# Patient Record
Sex: Male | Born: 1960 | Race: Black or African American | Hispanic: No | Marital: Single | Smoking: Current every day smoker
Health system: Southern US, Community
[De-identification: ages and names within clinical notes are randomized; demographics above are authoritative.]

## PROBLEM LIST (undated history)

## (undated) DIAGNOSIS — I10 Essential (primary) hypertension: Secondary | ICD-10-CM

## (undated) HISTORY — PX: BACK SURGERY: SHX140

---

## 2008-03-27 ENCOUNTER — Ambulatory Visit: Payer: Self-pay | Admitting: Family Medicine

## 2008-03-27 DIAGNOSIS — F17200 Nicotine dependence, unspecified, uncomplicated: Secondary | ICD-10-CM | POA: Insufficient documentation

## 2008-03-27 DIAGNOSIS — R062 Wheezing: Secondary | ICD-10-CM | POA: Insufficient documentation

## 2008-03-27 DIAGNOSIS — F528 Other sexual dysfunction not due to a substance or known physiological condition: Secondary | ICD-10-CM | POA: Insufficient documentation

## 2008-03-27 DIAGNOSIS — I10 Essential (primary) hypertension: Secondary | ICD-10-CM | POA: Insufficient documentation

## 2008-03-27 DIAGNOSIS — D179 Benign lipomatous neoplasm, unspecified: Secondary | ICD-10-CM | POA: Insufficient documentation

## 2008-03-27 LAB — CONVERTED CEMR LAB
Ketones, urine, test strip: NEGATIVE
Protein, U semiquant: NEGATIVE
Specific Gravity, Urine: 1.015
Urobilinogen, UA: 0.2
pH: 7

## 2008-03-29 ENCOUNTER — Encounter (INDEPENDENT_AMBULATORY_CARE_PROVIDER_SITE_OTHER): Payer: Self-pay | Admitting: Family Medicine

## 2008-03-31 LAB — CONVERTED CEMR LAB
Albumin: 4.3 g/dL (ref 3.5–5.2)
BUN: 17 mg/dL (ref 6–23)
Basophils Absolute: 0 10*3/uL (ref 0.0–0.1)
Basophils Relative: 0 % (ref 0–1)
Calcium: 9 mg/dL (ref 8.4–10.5)
Chloride: 106 meq/L (ref 96–112)
Cholesterol: 137 mg/dL (ref 0–200)
Creatinine, Ser: 0.88 mg/dL (ref 0.40–1.50)
Eosinophils Relative: 2 % (ref 0–5)
Glucose, Bld: 101 mg/dL — ABNORMAL HIGH (ref 70–99)
HCT: 42.8 % (ref 39.0–52.0)
Lymphocytes Relative: 22 % (ref 12–46)
MCV: 90.5 fL (ref 78.0–100.0)
Neutro Abs: 5.3 10*3/uL (ref 1.7–7.7)
Neutrophils Relative %: 67 % (ref 43–77)
PSA: 0.65 ng/mL (ref 0.10–4.00)
Platelets: 300 10*3/uL (ref 150–400)
RBC: 4.73 M/uL (ref 4.22–5.81)
TSH: 1.388 microintl units/mL (ref 0.350–4.50)
Total CHOL/HDL Ratio: 3
Triglycerides: 65 mg/dL (ref <150)
WBC: 7.9 10*3/uL (ref 4.0–10.5)

## 2008-04-01 ENCOUNTER — Ambulatory Visit (HOSPITAL_COMMUNITY): Admission: RE | Admit: 2008-04-01 | Discharge: 2008-04-01 | Payer: Self-pay | Admitting: Family Medicine

## 2008-04-01 DIAGNOSIS — J984 Other disorders of lung: Secondary | ICD-10-CM | POA: Insufficient documentation

## 2008-04-04 ENCOUNTER — Ambulatory Visit (HOSPITAL_COMMUNITY): Admission: RE | Admit: 2008-04-04 | Discharge: 2008-04-04 | Payer: Self-pay | Admitting: Family Medicine

## 2008-04-08 ENCOUNTER — Encounter (INDEPENDENT_AMBULATORY_CARE_PROVIDER_SITE_OTHER): Payer: Self-pay | Admitting: Family Medicine

## 2008-04-14 ENCOUNTER — Ambulatory Visit: Payer: Self-pay | Admitting: Family Medicine

## 2008-04-14 LAB — CONVERTED CEMR LAB
Cholesterol, target level: 200 mg/dL
HDL goal, serum: 40 mg/dL
LDL Goal: 130 mg/dL

## 2008-04-21 ENCOUNTER — Ambulatory Visit: Payer: Self-pay | Admitting: Family Medicine

## 2008-04-21 DIAGNOSIS — IMO0002 Reserved for concepts with insufficient information to code with codable children: Secondary | ICD-10-CM | POA: Insufficient documentation

## 2008-04-24 ENCOUNTER — Ambulatory Visit: Payer: Self-pay | Admitting: Family Medicine

## 2008-04-28 ENCOUNTER — Ambulatory Visit (HOSPITAL_COMMUNITY): Admission: RE | Admit: 2008-04-28 | Discharge: 2008-04-28 | Payer: Self-pay | Admitting: Family Medicine

## 2008-04-28 ENCOUNTER — Encounter (INDEPENDENT_AMBULATORY_CARE_PROVIDER_SITE_OTHER): Payer: Self-pay | Admitting: Family Medicine

## 2008-04-28 ENCOUNTER — Ambulatory Visit: Payer: Self-pay | Admitting: Cardiology

## 2008-05-19 ENCOUNTER — Ambulatory Visit: Payer: Self-pay | Admitting: Family Medicine

## 2008-05-19 DIAGNOSIS — R252 Cramp and spasm: Secondary | ICD-10-CM | POA: Insufficient documentation

## 2009-08-25 ENCOUNTER — Encounter: Payer: Self-pay | Admitting: Physician Assistant

## 2009-09-01 ENCOUNTER — Emergency Department (HOSPITAL_COMMUNITY)
Admission: EM | Admit: 2009-09-01 | Discharge: 2009-09-01 | Payer: Self-pay | Source: Home / Self Care | Admitting: Emergency Medicine

## 2010-04-19 ENCOUNTER — Ambulatory Visit: Payer: Self-pay | Admitting: Internal Medicine

## 2010-04-19 DIAGNOSIS — K625 Hemorrhage of anus and rectum: Secondary | ICD-10-CM | POA: Insufficient documentation

## 2010-05-08 IMAGING — CT CT CHEST W/ CM
1 series · 16 of 31 positions shown, 20 images · IV contrast (Omnipaque 300)
Comparison: None
Correlation:  Chest radiograph 04/01/2008

CLINICAL DATA: Abnormal chest x-ray, right lower lobe pulmonary
nodule

CT CHEST WITH CONTRAST
TECHNIQUE: Multidetector CT imaging of the chest was performed
following the standard protocol during bolus administration of
intravenous contrast.Sagittal and coronal MPR images reconstructed
from axial data set.
Contrast: 80 ml [id number]-[id number]

[Series 2: chestroutine 5.0 b40f · axial · 0.72mm/px · z∈[+785,+1060]mm · 16 of 61 slices shown, 20 images]
[im 3/61  mediastinal]
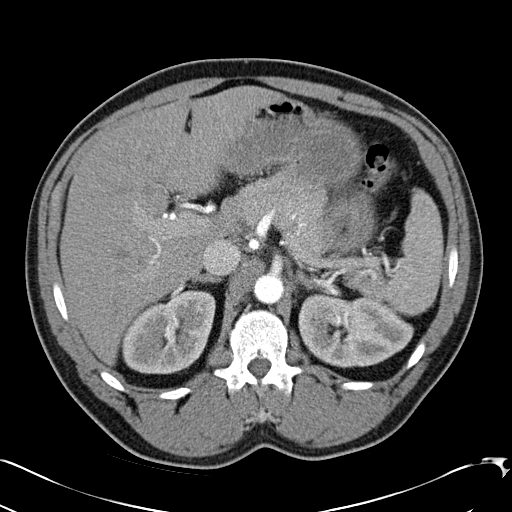
[im 3/61  lung]
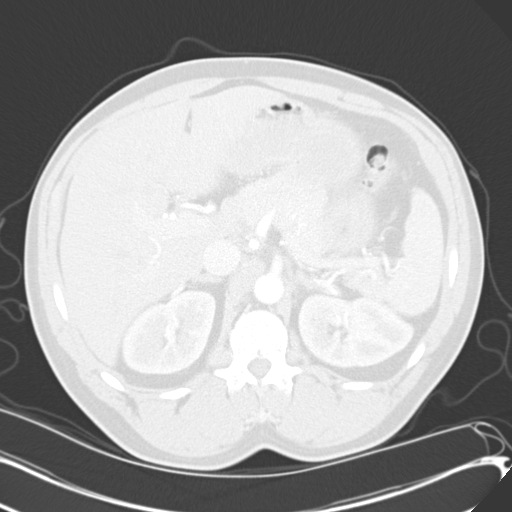
[im 7/61  lung]
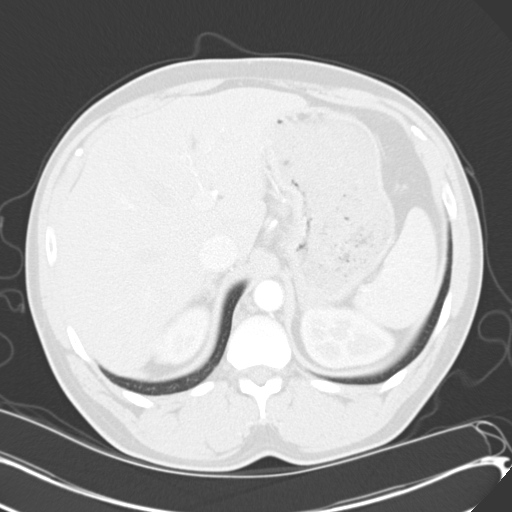
[im 12/61  lung]
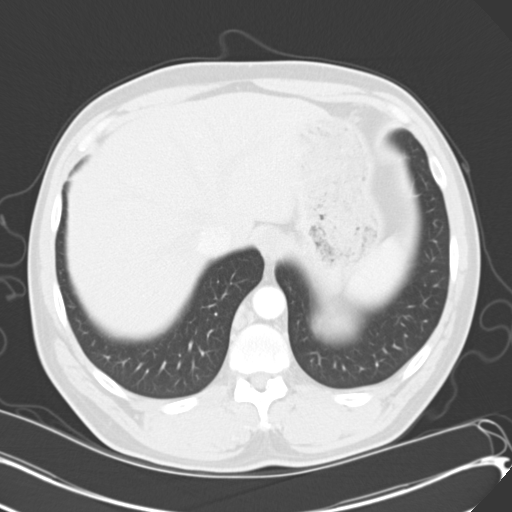
[im 14/61  lung]
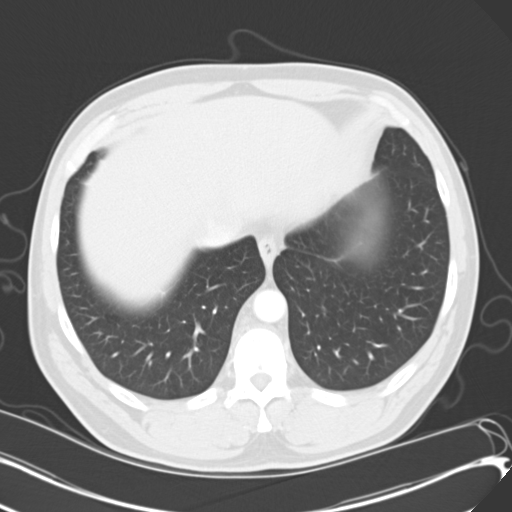
[im 18/61  mediastinal]
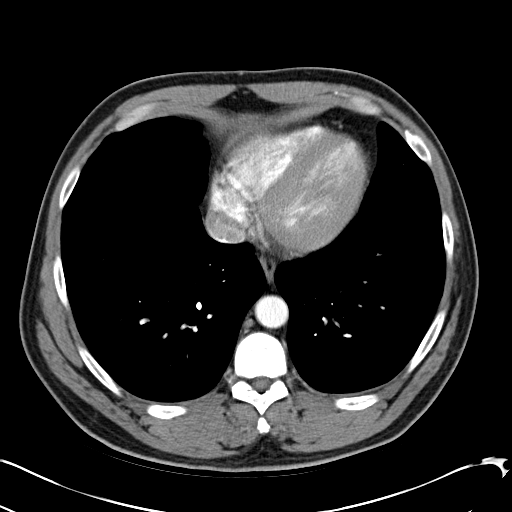
[im 18/61  lung]
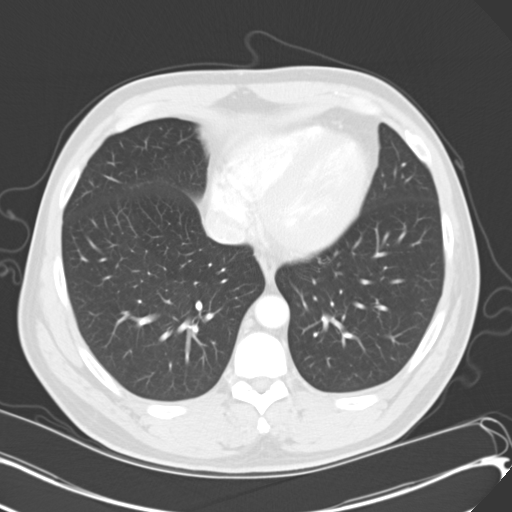
[im 21/61  lung]
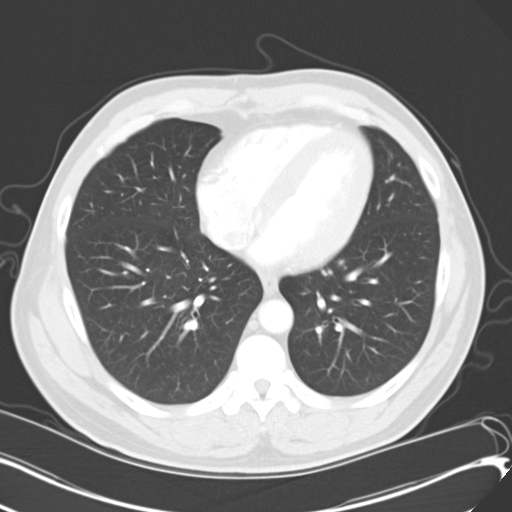
[im 25/61  lung]
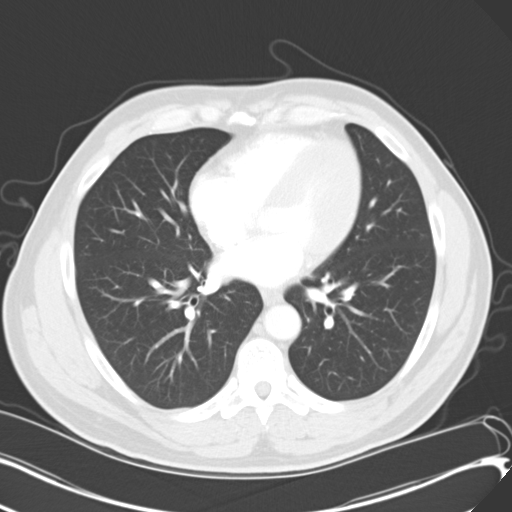
[im 29/61  lung]
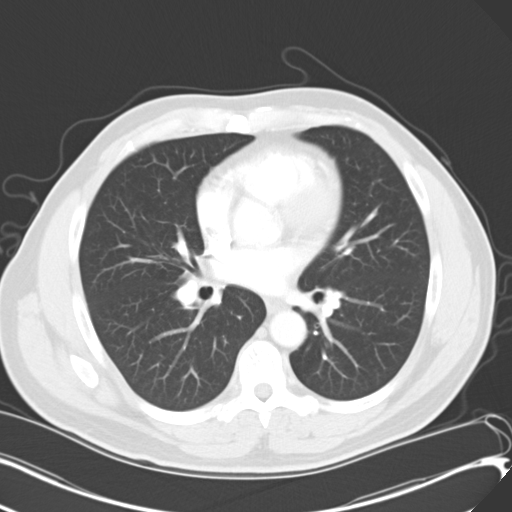
[im 33/61  mediastinal]
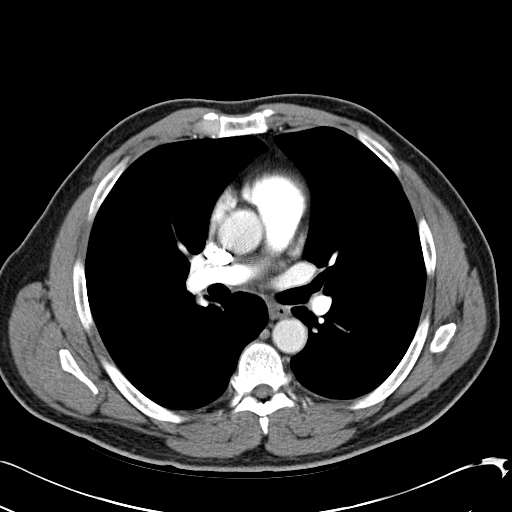
[im 33/61  lung]
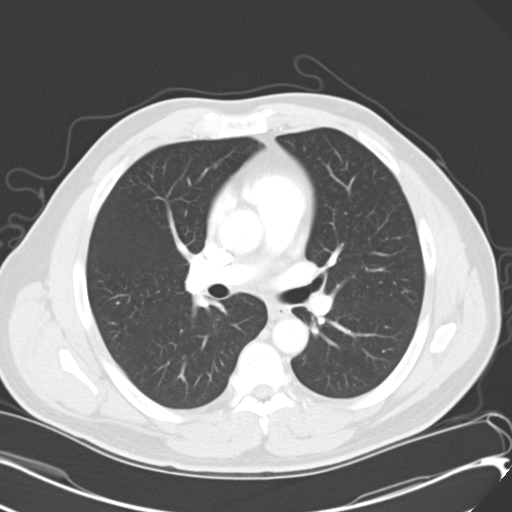
[im 36/61  lung]
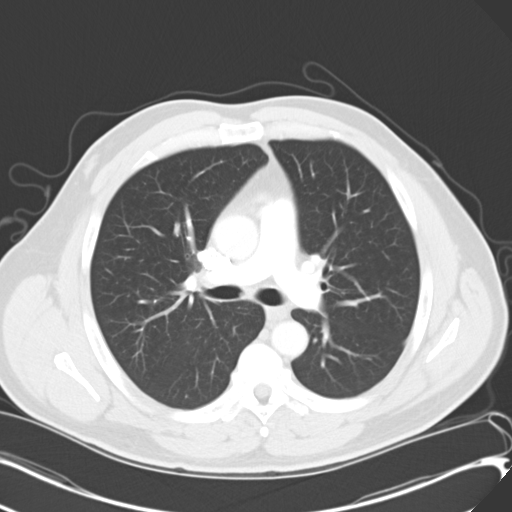
[im 38/61  lung]
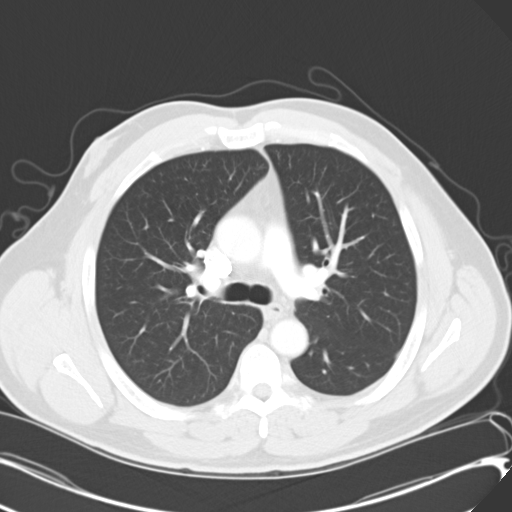
[im 41/61  lung]
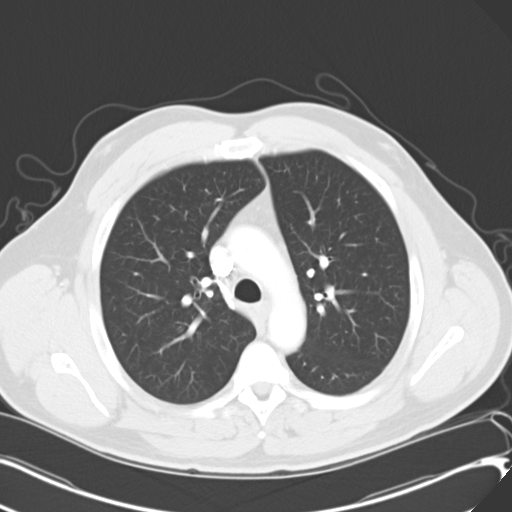
[im 45/61  mediastinal]
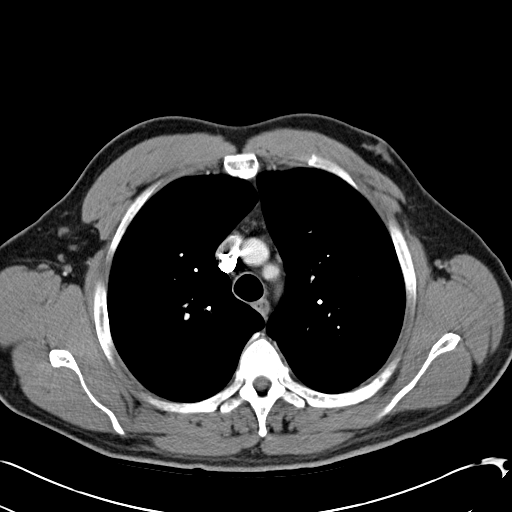
[im 45/61  lung]
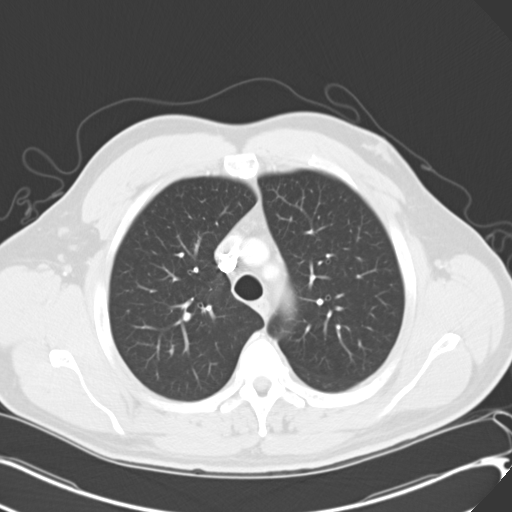
[im 49/61  lung]
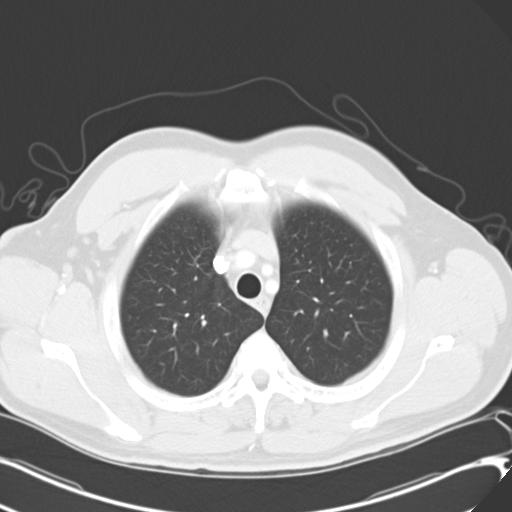
[im 54/61  lung]
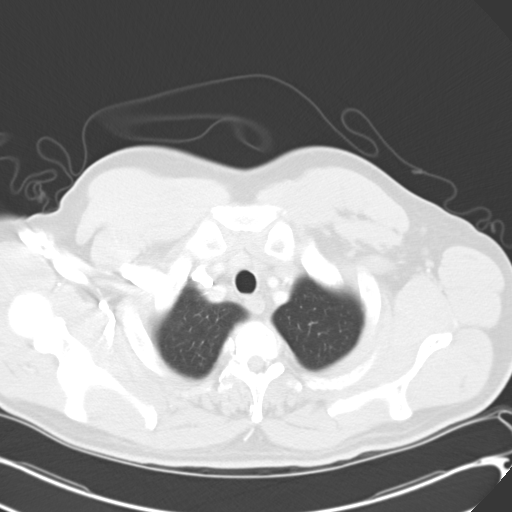
[im 58/61  lung]
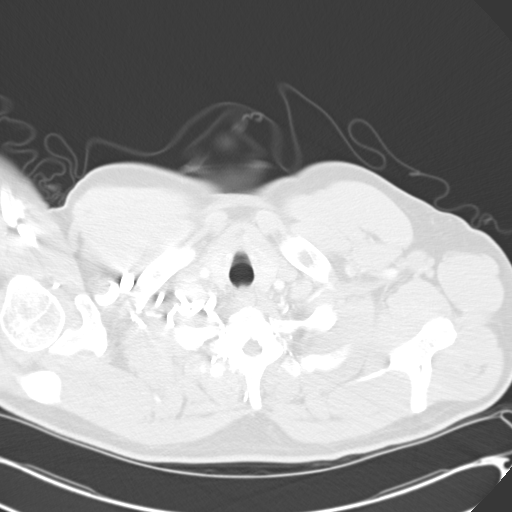

[16 of 31 positions shown; findings below may reference images not displayed]

FINDINGS: Thoracic vascular structures grossly patent on non dedicated exam.
Aorta normal caliber.
No thoracic adenopathy.
Visualized portion of upper abdomen normal.
No pulmonary mass, nodule, infiltrate, or effusion.
The observed radiographic abnormality is is shown to be due to a
bone island in a posterior lower right rib, image 41.
IMPRESSION: No evidence of pulmonary nodule.
Bone island in a posterior lower right rib, approximately ninth,
accounting for chest x-ray abnormality.

## 2010-05-14 ENCOUNTER — Ambulatory Visit (HOSPITAL_COMMUNITY)
Admission: RE | Admit: 2010-05-14 | Discharge: 2010-05-14 | Payer: Self-pay | Source: Home / Self Care | Attending: Internal Medicine | Admitting: Internal Medicine

## 2010-06-15 NOTE — Letter (Signed)
Summary: MEDICAL RELEASE  MEDICAL RELEASE   Imported By: Lind Guest 08/25/2009 11:09:26  _____________________________________________________________________  External Attachment:    Type:   Image     Comment:   External Document

## 2010-06-15 NOTE — Letter (Signed)
Summary: TCS ORDER  TCS ORDER   Imported By: Ave Filter 04/19/2010 10:16:03  _____________________________________________________________________  External Attachment:    Type:   Image     Comment:   External Document

## 2010-06-15 NOTE — Op Note (Signed)
  NAME:  Mejia, Timothy              ACCOUNT NO.:  [id number]  MEDICAL RECORD NO.:  [id number]          PATIENT TYPE:  AMB  LOCATION:  DAY                           FACILITY:  [facility]  PHYSICIAN:  R. Roetta Sessions, M.D. DATE OF BIRTH:  November 16, 1960  DATE OF PROCEDURE:  05/14/2010 DATE OF DISCHARGE:                              OPERATIVE REPORT   INDICATIONS FOR PROCEDURE:  A 50 year old gentleman with intermittent paper hematochezia over the past several weeks.  However, over the past 3 weeks since he was seen in our office on April 19, 2010, he has not had any further episodes.  He has not had any other bowel dysfunction such as constipation or diarrhea.  No associated abdominal pain.  No family history of colon polyps or colon cancer.  He has never had his lower GI tract evaluated previously.  Colonoscopy is now being done to further evaluate his symptoms.  Risks, benefits, limitations and alternatives have been reviewed, questions answered.  Please see the documentation in the medical record.  PROCEDURE NOTE:  O2 saturation, blood pressure, pulse and respirations are monitored throughout the entirety of the procedure.  CONSCIOUS SEDATION: 1. Versed 3 mg IV. 2. Demerol 75 mg IV in divided doses.  INSTRUMENT:  Pentax video chip system.  FINDINGS:  Digital rectal exam revealed no abnormalities.  ENDOSCOPIC FINDINGS:  Prep was good.  Colonic mucosa was surveyed from the rectosigmoid junction through the left transverse right colon to the appendiceal orifice, ileocecal valve/cecum.  There structures were well seen and photographed for the record.  From this level, scope was slowly and cautiously withdrawn.  All previously mentioned mucosal surfaces were again seen.  The patient had scattered left-sided diverticula. However, the remainder of the colonic mucosa appeared entirely normal. Scope was pulled down into the rectum, where a thorough examination of the rectal mucosa  including the retroflexed view of the anal verge and en face view of the anal canal demonstrated minimally friable anal canal hemorrhoids, otherwise rectal mucosa appeared normal.  The patient tolerated the procedure well.  Cecal withdrawal time 7 minutes.  IMPRESSION: 1. Friable anal canal hemorrhoids, otherwise normal rectum, scattered     left-sided diverticula.  The remainder of the colonic mucosa     appeared normal. 2. I suspect bleeding from hemorrhoids intermittently.  RECOMMENDATIONS: 1. Hemorrhoid in diverticulosis literature provided to Mr. Ground. 2. A 10-day course of Anusol HC suppositories one per rectum at     bedtime. 3. Bolster daily fiber intake ideally, supplement such as Benefiber,     Metamucil or Citrucel would be helpful. 4. Screening colonoscopy in 10 years.     Jonathon Bellows, M.D.     RMR/MEDQ  D:  05/14/2010  T:  05/14/2010  Job:  [id number]  cc:   Tesfaye D. Felecia Shelling, MD Fax: [phone]  Electronically Signed by Lorrin Goodell M.D. on 06/15/2010 09:08:03 AM
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
## 2011-11-06 ENCOUNTER — Emergency Department (HOSPITAL_COMMUNITY)
Admission: EM | Admit: 2011-11-06 | Discharge: 2011-11-06 | Disposition: A | Discharge status: Home / Self Care | Payer: 59 | Attending: Emergency Medicine | Admitting: Emergency Medicine

## 2011-11-06 ENCOUNTER — Encounter (HOSPITAL_COMMUNITY): Payer: Self-pay | Admitting: *Deleted

## 2011-11-06 DIAGNOSIS — Z888 Allergy status to other drugs, medicaments and biological substances status: Secondary | ICD-10-CM | POA: Insufficient documentation

## 2011-11-06 DIAGNOSIS — H109 Unspecified conjunctivitis: Secondary | ICD-10-CM | POA: Insufficient documentation

## 2011-11-06 DIAGNOSIS — Z886 Allergy status to analgesic agent status: Secondary | ICD-10-CM | POA: Insufficient documentation

## 2011-11-06 DIAGNOSIS — J069 Acute upper respiratory infection, unspecified: Secondary | ICD-10-CM | POA: Insufficient documentation

## 2011-11-06 DIAGNOSIS — Z79899 Other long term (current) drug therapy: Secondary | ICD-10-CM | POA: Insufficient documentation

## 2011-11-06 DIAGNOSIS — I10 Essential (primary) hypertension: Secondary | ICD-10-CM | POA: Insufficient documentation

## 2011-11-06 HISTORY — DX: Essential (primary) hypertension: I10

## 2011-11-06 MED ORDER — DM-GUAIFENESIN ER 30-600 MG PO TB12: 1.0000 | ORAL_TABLET | Freq: Two times a day (BID) | ORAL | Status: AC

## 2011-11-06 MED ORDER — AZITHROMYCIN 250 MG PO TABS: 250.0000 mg | ORAL_TABLET | Freq: Every day | ORAL | Status: AC

## 2011-11-06 NOTE — ED Provider Notes (Signed)
History   This chart was scribed for Shelda Jakes, MD by Clarita Crane. The patient was seen in room [facility]. Patient's care was started at 1226.    CSN: [id number]  Arrival date & time 11/06/11  1226   First MD Initiated Contact with Patient 11/06/11 1247      Chief Complaint  Patient presents with  . Fatigue    (Consider location/radiation/quality/duration/timing/severity/associated sxs/prior treatment) HPI DARSHAN SOLANKI is a 51 y.o. male who presents to the Emergency Department complaining of abnormal sensation to bilateral eyes described as "running" onset several days ago and persistent since with associated fatigue, sinus pressure, productive cough with white sputum, sore throat, mild dizziness, redness to bilateral eyes along with pruritus and watery discharge from left eye. Patient also notes experiencing cold symptoms which began 1 week ago but have been gradually improving over the past several days. Patient also notes that his blood pressure has been elevated for the past 2 days. Denies fever, eye pain, blurred vision, nausea, vomiting, diarrhea, chest pain, SOB, HA, dysuria, abdominal pain, back pain, rash. Patient with h/o HTN and is a current everyday smoker.   PCP- Felecia Shelling  Past Medical History  Diagnosis Date  . Hypertension     Past Surgical History  Procedure Date  . Back surgery     No family history on file.  History  Substance Use Topics  . Smoking status: Current Everyday Smoker  . Smokeless tobacco: Not on file  . Alcohol Use: Yes     Occ      Review of Systems  Constitutional: Positive for fatigue. Negative for fever and chills.  HENT: Positive for sore throat. Negative for rhinorrhea and neck pain.   Eyes: Positive for discharge (watery), redness and itching. Negative for pain and visual disturbance.  Respiratory: Positive for cough. Negative for shortness of breath.   Cardiovascular: Negative for chest pain.  Gastrointestinal:  Negative for nausea, vomiting, abdominal pain and diarrhea.  Genitourinary: Negative for dysuria.  Musculoskeletal: Negative for back pain.  Skin: Negative for rash.  Neurological: Positive for dizziness. Negative for weakness.    Allergies  Albuterol and Aspirin  Home Medications   Current Outpatient Rx  Name Route Sig Dispense Refill  . AZITHROMYCIN 250 MG PO TABS Oral Take 1 tablet (250 mg total) by mouth daily. Take first 2 tablets together, then 1 every day until finished. 6 tablet 0  . DM-GUAIFENESIN ER 30-600 MG PO TB12 Oral Take 1 tablet by mouth every 12 (twelve) hours. 14 tablet 0    BP 151/90  Pulse 68  Temp 98.1 F (36.7 C) (Oral)  Resp 20  SpO2 99%  Physical Exam  Nursing note and vitals reviewed. Constitutional: He is oriented to person, place, and time. He appears well-developed and well-nourished. No distress.  HENT:  Head: Normocephalic and atraumatic.       Mild posterior oropharynx erythema but no exudates noted.   Eyes: EOM are normal. Pupils are equal, round, and reactive to light.       Conjunctiva and sclera mildly red, left worse than right.   Neck: Neck supple. No tracheal deviation present.  Cardiovascular: Normal rate and regular rhythm.  Exam reveals no gallop and no friction rub.   No murmur heard. Pulmonary/Chest: Effort normal. No respiratory distress. He has no wheezes. He has no rales.  Abdominal: Soft. Bowel sounds are normal. He exhibits no distension. There is no tenderness.  Musculoskeletal: Normal range of motion. He exhibits no edema.  Lymphadenopathy:    He has no cervical adenopathy.  Neurological: He is alert and oriented to person, place, and time. No cranial nerve deficit or sensory deficit.       Distal neurovascular intact.   Skin: Skin is warm and dry.  Psychiatric: He has a normal mood and affect. His behavior is normal.    ED Course  Procedures (including critical care time)  DIAGNOSTIC STUDIES: Oxygen Saturation is  99% on room air, normal by my interpretation.    COORDINATION OF CARE: 1:14PM-Patient informed of current plan for treatment and evaluation and agrees with plan at this time.     Labs Reviewed - No data to display No results found.   1. Upper respiratory infection   2. Hypertension   3. Conjunctivitis       MDM  Patient very concerned about his blood pressure blood pressure in the emergency department is 151/91 patient is treated for hypertension followed by Dr. Felecia Shelling is an upper respiratory infection that got worse on Friday associated with congestion cough some fever redness in both eyes left greater than right some itching to the left eye no pain a little bit of swelling and little bit of pressure behind the eye and a 2 left maxillary sinus area. Patient is not using a decongestion says that the cause of the elevated blood pressure I suspect the blood pressure is up due to the stress from the upper respiratory infection. Patient does have a productive cough treat with Zithromax for potential pneumonia and/or for sinus problem. But suspect it is a viral etiology we'll start Mucinex. Have patient keep a daily log of his blood pressure for his primary care Dr. Followup with primary care Dr. In the next 2 days. Return for new or worse symptoms.      I personally performed the services described in this documentation, which was scribed in my presence. The recorded information has been reviewed and considered.    Shelda Jakes, MD 11/06/11 1346

## 2011-11-06 NOTE — Discharge Instructions (Signed)
followup with your regular doctor for blood pressure check in the next few days. Recommend keeping a daily log of your blood pressure to help your Dr. Tama Mejia her blood pressure medicine if needed. Take antibiotic as directed use Mucinex DM as directed return for new or worse symptoms. Work note provided.

## 2011-11-06 NOTE — ED Notes (Signed)
Pt states left eye has been "running" and blood pressure has been high. Pt also states,"I stay tired all the time and don't feel like doing nothing" NAD

## 2014-11-27 ENCOUNTER — Encounter (HOSPITAL_COMMUNITY): Payer: Self-pay | Admitting: Emergency Medicine

## 2014-11-27 ENCOUNTER — Emergency Department (HOSPITAL_COMMUNITY)
Admission: EM | Admit: 2014-11-27 | Discharge: 2014-11-27 | Disposition: A | Discharge status: Home / Self Care | Payer: BLUE CROSS/BLUE SHIELD | Attending: Emergency Medicine | Admitting: Emergency Medicine

## 2014-11-27 DIAGNOSIS — J3489 Other specified disorders of nose and nasal sinuses: Secondary | ICD-10-CM | POA: Diagnosis not present

## 2014-11-27 DIAGNOSIS — K029 Dental caries, unspecified: Secondary | ICD-10-CM | POA: Diagnosis not present

## 2014-11-27 DIAGNOSIS — R0981 Nasal congestion: Secondary | ICD-10-CM | POA: Diagnosis not present

## 2014-11-27 DIAGNOSIS — R062 Wheezing: Secondary | ICD-10-CM | POA: Diagnosis not present

## 2014-11-27 DIAGNOSIS — I10 Essential (primary) hypertension: Secondary | ICD-10-CM | POA: Insufficient documentation

## 2014-11-27 DIAGNOSIS — Z720 Tobacco use: Secondary | ICD-10-CM | POA: Insufficient documentation

## 2014-11-27 DIAGNOSIS — M549 Dorsalgia, unspecified: Secondary | ICD-10-CM | POA: Insufficient documentation

## 2014-11-27 DIAGNOSIS — R0982 Postnasal drip: Secondary | ICD-10-CM | POA: Insufficient documentation

## 2014-11-27 DIAGNOSIS — R220 Localized swelling, mass and lump, head: Secondary | ICD-10-CM

## 2014-11-27 MED ORDER — ACETAMINOPHEN 500 MG PO TABS
1000.0000 mg | ORAL_TABLET | Freq: Once | ORAL | Status: AC
  Administered 2014-11-27: 1000 mg via ORAL
  Filled 2014-11-27: qty 2

## 2014-11-27 MED ORDER — LIDOCAINE HCL (PF) 1 % IJ SOLN
INTRAMUSCULAR | Status: AC
  Administered 2014-11-27: 2 mL
  Filled 2014-11-27: qty 5

## 2014-11-27 MED ORDER — CEFTRIAXONE SODIUM 1 G IJ SOLR
1.0000 g | Freq: Once | INTRAMUSCULAR | Status: AC
  Administered 2014-11-27: 1 g via INTRAMUSCULAR
  Filled 2014-11-27: qty 10

## 2014-11-27 MED ORDER — AMOXICILLIN 500 MG PO CAPS: 500.0000 mg | ORAL_CAPSULE | Freq: Three times a day (TID) | ORAL | Status: AC

## 2014-11-27 NOTE — ED Notes (Addendum)
Patient complaining of right facial swelling x 4 days. States "I have a bad tooth but my tooth doesn't hurt." Noted swelling to right side of face. Patient B/P 182/119 at triage. Patient states he has not taken his B/P medication for "6-7 months."

## 2014-11-27 NOTE — Discharge Instructions (Signed)
It is extremely important that she see a dentist systems possible for assistance with these advanced dental cavities and infection. Please use the Amoxil 3 times daily with food. Use Tylenol every 4 hours for discomfort. Your blood pressure is elevated at 195/121. This is in a dangerous range. Please see the physicians at the health department, the [facility], or [facility] for assistance with this blood pressure issue as soon as possible. Dental Caries Dental caries (also called tooth decay) is the most common oral disease. It can occur at any age but is more common in children and young adults.  HOW DENTAL CARIES DEVELOPS  The process of decay begins when bacteria and foods (particularly sugars and starches) combine in your mouth to produce plaque. Plaque is a substance that sticks to the hard, outer surface of a tooth (enamel). The bacteria in plaque produce acids that attack enamel. These acids may also attack the root surface of a tooth (cementum) if it is exposed. Repeated attacks dissolve these surfaces and create holes in the tooth (cavities). If left untreated, the acids destroy the other layers of the tooth.  RISK FACTORS  Frequent sipping of sugary beverages.   Frequent snacking on sugary and starchy foods, especially those that easily get stuck in the teeth.   Poor oral hygiene.   Dry mouth.   Substance abuse such as methamphetamine abuse.   Broken or poor-fitting dental restorations.   Eating disorders.   Gastroesophageal reflux disease (GERD).   Certain radiation treatments to the head and neck. SYMPTOMS In the early stages of dental caries, symptoms are seldom present. Sometimes white, chalky areas may be seen on the enamel or other tooth layers. In later stages, symptoms may include:  Pits and holes on the enamel.  Toothache after sweet, hot, or cold foods or drinks are consumed.  Pain around the tooth.  Swelling around the  tooth. DIAGNOSIS  Most of the time, dental caries is detected during a regular dental checkup. A diagnosis is made after a thorough medical and dental history is taken and the surfaces of your teeth are checked for signs of dental caries. Sometimes special instruments, such as lasers, are used to check for dental caries. Dental X-ray exams may be taken so that areas not visible to the eye (such as between the contact areas of the teeth) can be checked for cavities.  TREATMENT  If dental caries is in its early stages, it may be reversed with a fluoride treatment or an application of a remineralizing agent at the dental office. Thorough brushing and flossing at home is needed to aid these treatments. If it is in its later stages, treatment depends on the location and extent of tooth destruction:   If a small area of the tooth has been destroyed, the destroyed area will be removed and cavities will be filled with a material such as gold, silver amalgam, or composite resin.   If a large area of the tooth has been destroyed, the destroyed area will be removed and a cap (crown) will be fitted over the remaining tooth structure.   If the center part of the tooth (pulp) is affected, a procedure called a root canal will be needed before a filling or crown can be placed.   If most of the tooth has been destroyed, the tooth may need to be pulled (extracted). HOME CARE INSTRUCTIONS You can prevent, stop, or reverse dental caries at home by practicing good  oral hygiene. Good oral hygiene includes:  Thoroughly cleaning your teeth at least twice a day with a toothbrush and dental floss.   Using a fluoride toothpaste. A fluoride mouth rinse may also be used if recommended by your dentist or health care provider.   Restricting the amount of sugary and starchy foods and sugary liquids you consume.   Avoiding frequent snacking on these foods and sipping of these liquids.   Keeping regular visits with a  dentist for checkups and cleanings. PREVENTION   Practice good oral hygiene.  Consider a dental sealant. A dental sealant is a coating material that is applied by your dentist to the pits and grooves of teeth. The sealant prevents food from being trapped in them. It may protect the teeth for several years.  Ask about fluoride supplements if you live in a community without fluorinated water or with water that has a low fluoride content. Use fluoride supplements as directed by your dentist or health care provider.  Allow fluoride varnish applications to teeth if directed by your dentist or health care provider. Document Released: 01/22/2002 Document Revised: 09/16/2013 Document Reviewed: 05/04/2012 [facility] Patient Information 2015 [place], [place]. This information is not intended to replace advice given to you by your health care provider. Make sure you discuss any questions you have with your health care provider.  Hypertension Hypertension is another name for high blood pressure. High blood pressure forces your heart to work harder to pump blood. A blood pressure reading has two numbers, which includes a higher number over a lower number (example: 110/72). HOME CARE   Have your blood pressure rechecked by your doctor.  Only take medicine as told by your doctor. Follow the directions carefully. The medicine does not work as well if you skip doses. Skipping doses also puts you at risk for problems.  Do not smoke.  Monitor your blood pressure at home as told by your doctor. GET HELP IF:  You think you are having a reaction to the medicine you are taking.  You have repeat headaches or feel dizzy.  You have puffiness (swelling) in your ankles.  You have trouble with your vision. GET HELP RIGHT AWAY IF:   You get a very bad headache and are confused.  You feel weak, numb, or faint.  You get chest or belly (abdominal) pain.  You throw up (vomit).  You cannot breathe very  well. MAKE SURE YOU:   Understand these instructions.  Will watch your condition.  Will get help right away if you are not doing well or get worse. Document Released: 10/19/2007 Document Revised: 05/07/2013 Document Reviewed: 02/22/2013 [facility] Patient Information 2015 [place], [place]. This information is not intended to replace advice given to you by your health care provider. Make sure you discuss any questions you have with your health care provider.

## 2014-11-27 NOTE — ED Provider Notes (Signed)
CSN: [id number]     Arrival date & time 11/27/14  0092 History   First MD Initiated Contact with Patient 11/27/14 [phone]     Chief Complaint  Patient presents with  . Facial Swelling     (Consider location/radiation/quality/duration/timing/severity/associated sxs/prior Treatment) Patient is a 54 y.o. male presenting with tooth pain. The history is provided by the patient.  Dental Pain Location:  Generalized Quality:  No pain Severity:  Moderate Onset quality:  Gradual Duration:  3 days Timing:  Constant Progression:  Worsening Chronicity:  Recurrent Context: dental caries and poor dentition   Context: not trauma   Context comment:  Right facial swelling Relieved by:  Nothing Worsened by:  Nothing tried Ineffective treatments:  None tried Associated symptoms: congestion, facial swelling and gum swelling   Associated symptoms: no difficulty swallowing, no drooling, no neck pain and no trismus   Risk factors: alcohol problem, lack of dental care and smoking     Past Medical History  Diagnosis Date  . Hypertension    Past Surgical History  Procedure Laterality Date  . Back surgery     History reviewed. No pertinent family history. History  Substance Use Topics  . Smoking status: Current Every Day Smoker  . Smokeless tobacco: Not on file  . Alcohol Use: Yes     Comment: Occ    Review of Systems  HENT: Positive for congestion, dental problem, facial swelling, postnasal drip and sinus pressure. Negative for drooling.   Respiratory: Positive for wheezing.   Musculoskeletal: Positive for back pain. Negative for neck pain.  All other systems reviewed and are negative.     Allergies  Albuterol and Aspirin  Home Medications   Prior to Admission medications   Not on File   BP 195/121 mmHg  Pulse 91  Temp(Src) 97.7 F (36.5 C) (Oral)  Resp 16  Ht 5\' 11"  (1.803 m)  Wt 195 lb (88.451 kg)  BMI 27.21 kg/m2  SpO2 93% Physical Exam  Constitutional: He is oriented  to person, place, and time. He appears well-developed and well-nourished.  Non-toxic appearance.  HENT:  Head: Normocephalic.  Right Ear: Tympanic membrane and external ear normal.  Left Ear: Tympanic membrane and external ear normal.  The right face is swollen from the right lower orbit down to the jaw. There is some loss of the right nasolabial fold. There is mild tenderness to palpation on the right. The area is not hot.  There multiple dental caries present. There is swelling of the right upper and lower gum. The second and third molar on the right lower jaw and the first molar of the right upper jaw are decayed to the gum line. The airway is patent. There is no swelling under the tongue.  Minimal nasal congestion present.  Eyes: EOM and lids are normal. Pupils are equal, round, and reactive to light. Right eye exhibits no discharge. Left eye exhibits no discharge. No scleral icterus.  Neck: Normal range of motion. Neck supple. Carotid bruit is not present.  Cardiovascular: Normal rate, regular rhythm, normal heart sounds, intact distal pulses and normal pulses.   Pulmonary/Chest: No respiratory distress. He has wheezes.  Few scattered rhonchi present.  Abdominal: Soft. Bowel sounds are normal. There is no tenderness. There is no guarding.  Musculoskeletal: Normal range of motion.  Lymphadenopathy:       Head (right side): No submandibular adenopathy present.       Head (left side): No submandibular adenopathy present.    He has  no cervical adenopathy.  Neurological: He is alert and oriented to person, place, and time. He has normal strength. No cranial nerve deficit or sensory deficit.  Skin: Skin is warm and dry.  Psychiatric: He has a normal mood and affect. His speech is normal.  Nursing note and vitals reviewed.   ED Course  Procedures (including critical care time) Labs Review Labs Reviewed - No data to display  Imaging Review No results found.   EKG  Interpretation None      MDM  Blood pressure is elevated at 195/121. The patient states he has not taken his blood pressure medicine for about 6 months. The remainder the vital signs are within normal limits. Pulse oximetry is 93% on room air. Patient states that he is a long-term smoker.  The patient has a right facial swelling, swelling of the gums of the right upper and lower jaw, as well as multiple dental caries, right more than left. Suspect that the swelling of the face is related to dental caries. The patient will be treated with Rocephin in the emergency department. Prescription for Amoxil 500 mg and ibuprofen 800 mg will be given to the patient to use. Dental resources also given to the patient.    Final diagnoses:  None    *I have reviewed nursing notes, vital signs, and all appropriate lab and imaging results for this patient.**    Lily Kocher, PA-C 11/27/14 [place], PA-C 11/27/14 0354  Carmin Muskrat, MD 11/27/14 [phone]

## 2015-07-30 ENCOUNTER — Other Ambulatory Visit (HOSPITAL_COMMUNITY): Payer: Self-pay | Admitting: Respiratory Therapy

## 2015-07-30 DIAGNOSIS — G4730 Sleep apnea, unspecified: Secondary | ICD-10-CM

## 2015-09-03 ENCOUNTER — Other Ambulatory Visit (HOSPITAL_COMMUNITY): Payer: Self-pay | Admitting: Respiratory Therapy

## 2015-09-03 DIAGNOSIS — G4730 Sleep apnea, unspecified: Secondary | ICD-10-CM

## 2016-03-08 ENCOUNTER — Encounter (HOSPITAL_COMMUNITY): Payer: Self-pay | Admitting: Emergency Medicine

## 2016-03-08 ENCOUNTER — Emergency Department (HOSPITAL_COMMUNITY): Payer: BLUE CROSS/BLUE SHIELD

## 2016-03-08 ENCOUNTER — Emergency Department (HOSPITAL_COMMUNITY)
Admission: EM | Admit: 2016-03-08 | Discharge: 2016-03-08 | Disposition: A | Discharge status: Home / Self Care | Payer: BLUE CROSS/BLUE SHIELD | Attending: Emergency Medicine | Admitting: Emergency Medicine

## 2016-03-08 DIAGNOSIS — Z792 Long term (current) use of antibiotics: Secondary | ICD-10-CM | POA: Insufficient documentation

## 2016-03-08 DIAGNOSIS — K047 Periapical abscess without sinus: Secondary | ICD-10-CM | POA: Diagnosis not present

## 2016-03-08 DIAGNOSIS — I10 Essential (primary) hypertension: Secondary | ICD-10-CM | POA: Insufficient documentation

## 2016-03-08 DIAGNOSIS — F17200 Nicotine dependence, unspecified, uncomplicated: Secondary | ICD-10-CM | POA: Insufficient documentation

## 2016-03-08 DIAGNOSIS — Z79899 Other long term (current) drug therapy: Secondary | ICD-10-CM | POA: Insufficient documentation

## 2016-03-08 DIAGNOSIS — R220 Localized swelling, mass and lump, head: Secondary | ICD-10-CM | POA: Diagnosis present

## 2016-03-08 LAB — BASIC METABOLIC PANEL
Anion gap: 8 (ref 5–15)
BUN: 11 mg/dL (ref 6–20)
CHLORIDE: 101 mmol/L (ref 101–111)
CO2: 25 mmol/L (ref 22–32)
CREATININE: 1.12 mg/dL (ref 0.61–1.24)
Calcium: 9 mg/dL (ref 8.9–10.3)
GFR calc non Af Amer: >60 mL/min (ref >60)
Glucose, Bld: 134 mg/dL — ABNORMAL HIGH (ref 65–99)
POTASSIUM: 3.9 mmol/L (ref 3.5–5.1)
SODIUM: 134 mmol/L — AB (ref 135–145)

## 2016-03-08 LAB — CBC
HCT: 45.4 % (ref 39.0–52.0)
Hemoglobin: 15.9 g/dL (ref 13.0–17.0)
MCH: 31.4 pg (ref 26.0–34.0)
MCHC: 35 g/dL (ref 30.0–36.0)
MCV: 89.7 fL (ref 78.0–100.0)
PLATELETS: 218 10*3/uL (ref 150–400)
RBC: 5.06 MIL/uL (ref 4.22–5.81)
RDW: 13.8 % (ref 11.5–15.5)
WBC: 5.3 10*3/uL (ref 4.0–10.5)

## 2016-03-08 MED ORDER — IOPAMIDOL (ISOVUE-300) INJECTION 61%
75.0000 mL | Freq: Once | INTRAVENOUS | Status: AC | PRN
  Administered 2016-03-08: 75 mL via INTRAVENOUS

## 2016-03-08 MED ORDER — AMOXICILLIN-POT CLAVULANATE 875-125 MG PO TABS: 1.0000 | ORAL_TABLET | Freq: Two times a day (BID) | ORAL | 0 refills | Status: DC

## 2016-03-08 MED ORDER — CLINDAMYCIN PHOSPHATE 600 MG/50ML IV SOLN
600.0000 mg | Freq: Once | INTRAVENOUS | Status: AC
  Administered 2016-03-08: 600 mg via INTRAVENOUS
  Filled 2016-03-08: qty 50

## 2016-03-08 MED ORDER — LIDOCAINE HCL (PF) 1 % IJ SOLN
5.0000 mL | Freq: Once | INTRAMUSCULAR | Status: AC
  Administered 2016-03-08: 5 mL
  Filled 2016-03-08: qty 5

## 2016-03-08 MED ORDER — CLINDAMYCIN HCL 300 MG PO CAPS: 600.0000 mg | ORAL_CAPSULE | Freq: Three times a day (TID) | ORAL | 0 refills | Status: AC

## 2016-03-08 NOTE — ED Provider Notes (Signed)
HPI Comments: Timothy Mejia is a 55 y.o. male who presents to the Emergency Department complaining of constant right upper dental pain with associated right sided facial pain and swelling for the past 1 month. He states talking worsens his pain. He reports associated chills, subjective fever, and right sided HA. He reports taking amoxicillin for nearly 1 month. He denies trouble swallowing, vomiting. No trouble speaking.   Physical Examination: Airway intact. HENT: right sided facial swelling extending to right eye. Dentition is poor. Fluctuance along the upper right gingiva. Floor of mouth is soft. Able to touch tongue to roof of mouth.  No tongue or lip swelling. No uvula deviation. NECK: no meningismus   Attempted I+D in ED with minimal success. CT with large cystic lesion, probable abscess.  D/w Oral surgery Dr. Benson Norway who will see patient in office tomorrow. IV clindamycin given. Admission d/w Dr. Candiss Norse who does not feel patient should be admitted without oral surgery in house  Dr. Benson Norway does not come into [facility] or [facility]. Dr Randell Patient d/w [facility] oral surgery who felt patient could be treated with antibiotics and followed as outpatient.   Patient agreeable to outpatient followup. No evidence of ludwig's angina. Airway patent. Switch antibiotics to clindamycin. Return precautions discussed.  By signing my name below, I, [name], attest that this documentation has been prepared under the direction and in the presence of [name], MD. Electronically Signed: [name], Education administrator. 03/08/16. 9:09 AM.   I personally performed the services described in this documentation, which was scribed in my presence. The recorded information has been reviewed and is accurate.     [name], MD 03/08/16 [phone]

## 2016-03-08 NOTE — ED Notes (Signed)
Beeped ENT To [phone]. Per Dr. Randell Patient.

## 2016-03-08 NOTE — ED Triage Notes (Signed)
Patient complains of tooth abscess that has been treated by Dr. Luan Pulling with Amoxicillin. Patient states he has 4 pills left. States swelling started last night.

## 2016-03-08 NOTE — Discharge Instructions (Addendum)
Stop taking your old antibiotic (amoxicillin). Start taking your new antibiotic (clindamycin). Two tablets three times a day until you complete it. Please go to Dr. Raynelle Dick office tomorrow (Wednesday 03/09/2016) at [facility]. They may call you to have you come in earlier. Change gauze three times a day and as needed. You can remove the gauze if the bleeding has stopped.

## 2016-03-08 NOTE — ED Provider Notes (Signed)
[facility] DEPT Provider Note   CSN: [id number] Arrival date & time: 03/08/16  0830     History   Chief Complaint Chief Complaint  Patient presents with  . Oral Swelling    HPI Timothy Mejia is a 55 year old man with history of dental caries, hypertension.  HPI  He presents with oral swelling. He has had intermittent swelling for 4 months. Located on the right side of his face. He has pain is constant and sharp. It is located in his right upper cheek. He is not taking anything for pain. He has some cramping in his jaw but no locking. He is able to eat.   He has been taking amoxicillin.   Past Medical History:  Diagnosis Date  . Hypertension     Patient Active Problem List   Diagnosis Date Noted  . RECTAL BLEEDING 04/19/2010  . CRAMP IN LIMB 05/19/2008  . OTHER SPECIFIED SITES OF SPRAINS AND STRAINS 04/21/2008  . 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 LIPOMA 03/27/2008  . ERECTILE DYSFUNCTION 03/27/2008  . TOBACCO ABUSE 03/27/2008  . HYPERTENSION 03/27/2008  . WHEEZING 03/27/2008    Past Surgical History:  Procedure Laterality Date  . BACK SURGERY         Home Medications    Prior to Admission medications   Medication Sig Start Date End Date Taking? Authorizing Provider  amLODipine (NORVASC) 5 MG tablet Take 5 mg by mouth daily.   Yes Historical Provider, MD  cloNIDine (CATAPRES) 0.1 MG tablet Take 0.1 mg by mouth 2 (two) times daily.   Yes Historical Provider, MD  amoxicillin (AMOXIL) 500 MG capsule Take 1 capsule (500 mg total) by mouth 3 (three) times daily. Patient not taking: Reported on 03/08/2016 11/27/14   Lily Kocher, PA-C  amoxicillin-clavulanate (AUGMENTIN) 875-125 MG tablet Take 1 tablet by mouth 2 (two) times daily. 03/08/16   Milagros Loll, MD    Family History No family history on file.  Social History Social History  Substance Use Topics  . Smoking status: Current Every Day Smoker  . Smokeless tobacco: Never Used   . Alcohol use Yes     Comment: Occ     Allergies   Albuterol and Aspirin   Review of Systems Review of Systems Constitutional: +subjective fevers/chills Eyes: no vision changes Ears, nose, mouth, throat, and face: +cough, clear sputum Respiratory: no shortness of breath Cardiovascular: no chest pain Gastrointestinal: +nausea, no vomiting, no abdominal pain, no constipation, no diarrhea Genitourinary: no dysuria, no hematuria Integument: no rash Hematologic/lymphatic: +mouth bleeding, no bruising, no edema Musculoskeletal: no arthralgias, no myalgias Neurological: no paresthesias, no weakness   Physical Exam Updated Vital Signs BP (!) 176/111 (BP Location: Left Arm)   Pulse 87   Temp 98.4 F (36.9 C) (Oral)   Resp 16   Ht 5\' 11"  (1.803 m)   Wt 82.6 kg   SpO2 100%   BMI 25.38 kg/m   Physical Exam General Apperance: NAD Head: Normocephalic, atraumatic, right facial swelling to below right eye Eyes: PERRL, EOMI, anicteric sclera Ears: Normal external ear canal Nose: Nares normal, septum midline, mucosa normal Throat: Multiple dental caries, fluctuance along right upper gingiva. Neck: Supple, trachea midline Back: No tenderness or bony abnormality  Lungs: Clear to auscultation bilaterally. No wheezes, rhonchi or rales. Breathing comfortably Chest Wall: Nontender, no deformity Heart: Regular rate and rhythm, no murmur/rub/gallop Abdomen: Soft, nontender, nondistended, no rebound/guarding Extremities: Normal, atraumatic, warm and well perfused, no edema Pulses: 2+ throughout Skin: No  rashes or lesions Neurologic: Alert and oriented x 3. CNII-XII intact. Normal strength and sensation   ED Treatments / Results  Labs (all labs ordered are listed, but only abnormal results are displayed) Labs Reviewed  BASIC METABOLIC PANEL - Abnormal; Notable for the following:       Result Value   Sodium 134 (*)    Glucose, Bld 134 (*)    All other components within normal  limits  CBC    Radiology Ct Maxillofacial W Contrast  Result Date: 03/08/2016 CLINICAL DATA:  Right-sided facial swelling for 1 month without injury. EXAM: CT MAXILLOFACIAL WITH CONTRAST TECHNIQUE: Multidetector CT imaging of the maxillofacial structures was performed with intravenous contrast. Multiplanar CT image reconstructions were also generated. A small metallic BB was placed on the right temple in order to reliably differentiate right from left. CONTRAST:  54mL ISOVUE-300 IOPAMIDOL (ISOVUE-300) INJECTION 61% COMPARISON:  None. FINDINGS: Osseous: 3.8 x 2.5 x 2.5 cm cystic lesion with partially calcified margin is seen arising around the roots of the teeth in the right maxilla and extends into right maxillary sinus and out into soft tissues laterally. This is consistent with some form of odontogenic cyst or possibly abscess. No other osseous abnormality is noted. Orbits: Globes and orbits appear normal. Sinuses: Complete opacification of right maxillary sinus is noted consistent with sinusitis. Remaining paranasal sinuses appear normal. Soft tissues: Subcutaneous swelling of the right infraorbital tissues is noted suggesting cellulitis. Limited intracranial: No definite abnormality is seen. IMPRESSION: 3.8 x 2.5 x 2.5 cm cystic lesion with partially calcified margin is seen arising from the roots of the teeth of the right maxilla and extending into right maxillary sinus and out into soft tissues laterally. This is most consistent with some form of odontogenic cyst or possibly abscess. Clinical correlation is recommended. Swelling of the subcutaneous tissues of the right infraorbital region is noted suggesting cellulitis. Right maxillary sinusitis is noted. Electronically Signed   By: Marijo Conception, M.D.   On: 03/08/2016 11:32    Procedures Procedures  INCISION AND DRAINAGE Performed by: Jacques Earthly Consent: Verbal consent obtained. Risks and benefits: risks, benefits and alternatives were  discussed Type: abscess  Body area: Right upper gingiva  Anesthesia: local infiltration  Incision was made with a scalpel.  Local anesthetic: lidocaine 1% without epinephrine  Anesthetic total: 3 ml  Complexity: simple  Drainage: purulent  Drainage amount: <2ml  Packing material: None  Patient tolerance: Patient tolerated the procedure well with no immediate complications.  Medications Ordered in ED Medications  lidocaine (PF) (XYLOCAINE) 1 % injection 5 mL (not administered)  clindamycin (CLEOCIN) IVPB 600 mg (not administered)  iopamidol (ISOVUE-300) 61 % injection 75 mL (75 mLs Intravenous Contrast Given 03/08/16 1100)     Initial Impression / Assessment and Plan / ED Course  I have reviewed the triage vital signs and the nursing notes.  Pertinent labs & imaging results that were available during my care of the patient were reviewed by me and considered in my medical decision making (see chart for details).  Clinical Course   55 year old man with history of dental caries, hypertension presents with right facial swelling and right upper gingival fluctuance. Currently taking amoxicillin. Afebrile. BMP unremarkable. CBC with no leukocytosis. CT maxillofacial w contrast demonstrates 3.8 x 2.5 x 2.5 cm cystic lesion arising from the roots of the teeth of the right maxilla and extending into right maxillary sinus and out into soft tissues laterally. Swelling of the subcutaneous tissues of the right  infraorbital region. Incision and drainage performed with purulent drainage noted. IV clindamycin 600mg  given.  Final Clinical Impressions(s) / ED Diagnoses   Final diagnoses:  Dental abscess  Abscess 3.8 x 2.5 x 2.5 cm arising from the roots of the teeth of the right maxilla and extending into right maxillary sinus and out into soft tissues laterally. Discussed with Dr. Benson Norway (dental surgery). He will see the patient in the office in [facility] tomorrow 03/09/2016 around [place].  The office will be calling the patient to let him know when to come in. Will discharge with PO clindamycin 600mg  Q8hr for 7 day course. Antibiotic course may be changed by dental surgery.   New Prescriptions New Prescriptions   CLINDAMYCIN (CLEOCIN) 300 MG CAPSULE    Take 2 capsules (600 mg total) by mouth 3 (three) times daily.     Milagros Loll, MD 03/08/16 1247    Milagros Loll, MD 03/08/16 [place], MD 03/08/16 [phone]

## 2019-02-01 DIAGNOSIS — L723 Sebaceous cyst: Secondary | ICD-10-CM | POA: Diagnosis not present

## 2019-02-01 DIAGNOSIS — I10 Essential (primary) hypertension: Secondary | ICD-10-CM | POA: Diagnosis not present

## 2019-05-01 ENCOUNTER — Encounter: Payer: Self-pay | Admitting: Internal Medicine

## 2020-11-30 ENCOUNTER — Other Ambulatory Visit: Payer: Self-pay

## 2020-11-30 ENCOUNTER — Encounter (HOSPITAL_COMMUNITY): Payer: Self-pay | Admitting: *Deleted

## 2020-11-30 ENCOUNTER — Emergency Department (HOSPITAL_COMMUNITY)
Admission: EM | Admit: 2020-11-30 | Discharge: 2020-11-30 | Disposition: A | Discharge status: Home / Self Care | Payer: BC Managed Care – PPO | Attending: Emergency Medicine | Admitting: Emergency Medicine

## 2020-11-30 DIAGNOSIS — L0211 Cutaneous abscess of neck: Secondary | ICD-10-CM | POA: Diagnosis not present

## 2020-11-30 DIAGNOSIS — Z79899 Other long term (current) drug therapy: Secondary | ICD-10-CM | POA: Insufficient documentation

## 2020-11-30 DIAGNOSIS — L720 Epidermal cyst: Secondary | ICD-10-CM | POA: Diagnosis not present

## 2020-11-30 DIAGNOSIS — F17290 Nicotine dependence, other tobacco product, uncomplicated: Secondary | ICD-10-CM | POA: Insufficient documentation

## 2020-11-30 DIAGNOSIS — I10 Essential (primary) hypertension: Secondary | ICD-10-CM

## 2020-11-30 MED ORDER — AMLODIPINE BESYLATE 5 MG PO TABS: 5.0000 mg | ORAL_TABLET | Freq: Every day | ORAL | 1 refills | Status: DC

## 2020-11-30 MED ORDER — LIDOCAINE-EPINEPHRINE (PF) 2 %-1:200000 IJ SOLN
20.0000 mL | Freq: Once | INTRAMUSCULAR | Status: AC
  Administered 2020-11-30: 20 mL
  Filled 2020-11-30: qty 20

## 2020-11-30 MED ORDER — FENTANYL CITRATE (PF) 100 MCG/2ML IJ SOLN: 50.0000 ug | Freq: Once | INTRAMUSCULAR | Status: DC

## 2020-11-30 MED ORDER — AMLODIPINE BESYLATE 5 MG PO TABS
5.0000 mg | ORAL_TABLET | Freq: Once | ORAL | Status: DC
  Filled 2020-11-30: qty 1

## 2020-11-30 MED ORDER — OXYCODONE-ACETAMINOPHEN 5-325 MG PO TABS: 1.0000 | ORAL_TABLET | Freq: Once | ORAL | Status: DC

## 2020-11-30 NOTE — ED Triage Notes (Signed)
Pt states he has an abscess to left side of neck that has been draining for the last couple of days

## 2020-11-30 NOTE — ED Provider Notes (Signed)
[facility] EMERGENCY DEPARTMENT Provider Note   CSN: [id number] Arrival date & time: 11/30/20  1741     History Chief Complaint  Patient presents with   Abscess    Timothy Mejia is a 60 y.o. male.  HPI  60 year old male with a history of hypertension presents the emergency department today for evaluation of an abscess to the left side of his neck.  States this occurred in the past and he has had the area incised and drained by his primary care provider however his provider has retired and he was unable to follow-up.  Additionally he was noted to have high blood pressure today.  He notes he has not been able to take his hypertension medications because he no longer has a prescription.  He denies any headaches, lightheadedness, dizziness, chest pain or shortness of breath.  He has not had any fevers in relation to his abscess and has no other systemic symptoms.  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 No pertinent family history.  Social History   Tobacco Use   Smoking status: Every Day   Smokeless tobacco: Never  Substance Use Topics   Alcohol use: Yes    Comment: Occ   Drug use: No    Home Medications Prior to Admission medications   Medication Sig Start Date End Date Taking? Authorizing Provider  amLODipine (NORVASC) 5 MG tablet Take 1 tablet (5 mg total) by mouth daily. 11/30/20 01/29/21 Yes Maysun Meditz S, PA-C  amLODipine (NORVASC) 5 MG tablet Take 5 mg by mouth daily. Patient not taking: Reported on  11/30/2020    [provider]  amoxicillin (AMOXIL) 500 MG capsule Take 1 capsule (500 mg total) by mouth 3 (three) times daily. Patient not taking: No sig reported 11/27/14   Lily Kocher, PA-C  clindamycin (CLEOCIN) 300 MG capsule Take 2 capsules (600 mg total) by mouth 3 (three) times daily. Patient not taking: Reported on 11/30/2020 03/08/16   Milagros Loll, MD  cloNIDine (CATAPRES) 0.1 MG tablet Take 0.1 mg by mouth 2 (two) times daily. Patient not taking: Reported on 11/30/2020    [provider]    Allergies    Albuterol and Aspirin  Review of Systems   Review of Systems  Constitutional:  Negative for fever.  Respiratory:  Negative for shortness of breath.   Cardiovascular:  Negative for chest pain.  Neurological:  Negative for dizziness, weakness, light-headedness, numbness and headaches.   Physical Exam Updated Vital Signs BP (!) 210/128   Pulse 78   Temp 98.5 F (36.9 C) (Oral)   Resp 20   Ht 5\' 11"  (1.803 m)   Wt 82.6 kg   SpO2 100%   BMI 25.38 kg/m   Physical Exam Constitutional:      General: He is not in acute distress.    Appearance: He is well-developed.  HENT:     Mouth/Throat:     Comments: 2cm fluctuant area to the left side of  the neck that is actively draining purulent material when pressure is applied. No surrounding erythema, induration, or warmth. Eyes:     Conjunctiva/sclera: Conjunctivae normal.  Cardiovascular:     Rate and Rhythm: Normal rate and regular rhythm.  Pulmonary:     Effort: Pulmonary effort is normal.     Breath sounds: Normal breath sounds.  Skin:    General: Skin is warm and dry.  Neurological:     Mental Status: He is alert and oriented to person, place, and time.    ED Results / Procedures / Treatments   Labs (all labs ordered are listed, but only abnormal results are displayed) Labs Reviewed - No data to display  EKG None  Radiology No results found.  Procedures .Marland KitchenIncision and  Drainage  Date/Time: 11/30/2020 10:14 PM Performed by: Rodney Booze, PA-C Authorized by: Rodney Booze, PA-C   Consent:    Consent obtained:  Verbal   Consent given by:  Patient   Risks, benefits, and alternatives were discussed: yes     Risks discussed:  Bleeding, incomplete drainage, pain and infection   Alternatives discussed:  No treatment Universal protocol:    Procedure explained and questions answered to patient or proxy's satisfaction: yes     Immediately prior to procedure, a time out was called: yes     Patient identity confirmed:  Verbally with patient Location:    Type:  Cyst   Size:  2   Location:  Neck   Neck location:  L anterior Pre-procedure details:    Skin preparation:  Povidone-iodine Sedation:    Sedation type:  None Anesthesia:    Anesthesia method:  Local infiltration   Local anesthetic:  Lidocaine 2% WITH epi Procedure type:    Complexity:  Simple Procedure details:    Ultrasound guidance: no     Needle aspiration: no     Incision types:  Single straight   Incision depth:  Dermal   Drainage:  Purulent   Drainage amount:  Copious   Wound treatment:  Wound left open   Packing materials:  None Post-procedure details:    Procedure completion:  Tolerated   Medications Ordered in ED Medications  amLODipine (NORVASC) tablet 5 mg (has no administration in time range)  lidocaine-EPINEPHrine (XYLOCAINE W/EPI) 2 %-1:200000 (PF) injection 20 mL (20 mLs Infiltration Given 11/30/20 2139)    ED Course  I have reviewed the triage vital signs and the nursing notes.  Pertinent labs & imaging results that were available during my care of the patient were reviewed by me and considered in my medical decision making (see chart for details).    MDM Rules/Calculators/A&P                          Patient with skin abscess amenable to incision and drainage.  Abscess was not large enough to warrant packing or drain,  wound recheck in 2 days. Encouraged  home warm soaks and flushing.  Mild signs of cellulitis is surrounding skin.  Will d/c to home.  Rx for abx given.  He is also noted to be significantly hypertensive.  He notes that this is because he is very frustrated after having been here for several hours and having to wait.  He also notes he has not been taking his blood pressure medication because it is regular doctor retired.  Will refill his Rx for amlodipine and give him a dose here before he goes.  He has  no chest pain, shortness of breath, headaches to suggest hypertensive emergency at this time.  He agrees to follow-up with PCP and return if worse.   Final Clinical Impression(s) / ED Diagnoses Final diagnoses:  Epidermal cyst  Hypertension, unspecified type    Rx / DC Orders ED Discharge Orders          Ordered    amLODipine (NORVASC) 5 MG tablet  Daily        11/30/20 2214             Rodney Booze, PA-C 11/30/20 2215    Daleen Bo, MD 12/01/20 0013

## 2020-11-30 NOTE — Discharge Instructions (Addendum)
You were given a prescription for antibiotics. Please take the antibiotic prescription fully.   Follow up with your dermatologist or [facility] for reassessment and definitive management of the cyst.   Please keep a log of your blood pressures at home and bring this log when he follow-up with the PCP that was provided for you on your discharge paperwork.  If you have any chest pain, shortness of breath, strokelike symptoms or any evidence of infection to the area on your neck you should return to the emergency department immediately

## 2021-02-13 ENCOUNTER — Encounter: Payer: Self-pay | Admitting: Emergency Medicine

## 2021-02-13 ENCOUNTER — Ambulatory Visit
Admission: EM | Admit: 2021-02-13 | Discharge: 2021-02-13 | Disposition: A | Discharge status: Home / Self Care | Payer: BC Managed Care – PPO | Attending: Family Medicine | Admitting: Family Medicine

## 2021-02-13 DIAGNOSIS — I10 Essential (primary) hypertension: Secondary | ICD-10-CM

## 2021-02-13 MED ORDER — AMLODIPINE BESYLATE 10 MG PO TABS: 10.0000 mg | ORAL_TABLET | Freq: Every day | ORAL | 0 refills | Status: DC

## 2021-02-13 MED ORDER — AMLODIPINE BESYLATE 10 MG PO TABS: 10.0000 mg | ORAL_TABLET | Freq: Every day | ORAL | 0 refills | Status: AC

## 2021-02-13 MED ORDER — CLONIDINE HCL 0.1 MG PO TABS
0.1000 mg | ORAL_TABLET | Freq: Once | ORAL | Status: AC
  Administered 2021-02-13: 0.1 mg via ORAL

## 2021-02-13 NOTE — ED Triage Notes (Signed)
Pt requests medication refill for Amlodipine 5 mg PO QD (last took yesterday). BP today 224/118 HR 80

## 2021-02-13 NOTE — ED Provider Notes (Signed)
[facility]    CSN: [id number] Arrival date & time: 02/13/21  1209      History   Chief Complaint Chief Complaint  Patient presents with   Medication Refill    HPI Timothy Mejia is a 60 y.o. male.   HPI Patient presents today for medication refill.  On arrival patient's blood pressure 208/108 Patient was given clonidine 0.1 mg and blood pressure was reduced to 192/99.  Patient was seen at the ER back in July and started on amlodipine.  He is without a primary care doctor.  Denies any associated symptoms of headache, chest pain or shortness of breath.  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 Authorizing Provider  amLODipine (NORVASC) 10 MG tablet Take 1 tablet (10 mg total) by mouth daily. 02/13/21   Scot Jun, FNP  amoxicillin (AMOXIL) 500 MG capsule Take 1 capsule (500 mg total) by mouth 3 (three) times daily. Patient not taking: No sig reported 11/27/14   Lily Kocher, PA-C  clindamycin (CLEOCIN) 300 MG capsule Take 2 capsules (600 mg total) by mouth 3 (three) times daily. Patient not taking: Reported on 11/30/2020 03/08/16   Milagros Loll, MD  cloNIDine (CATAPRES) 0.1 MG tablet Take 0.1 mg by mouth 2 (two) times daily. Patient not taking: Reported on 11/30/2020    [provider]    Family History History reviewed. No pertinent family history.  Social History Social History   Tobacco Use    Smoking status: Every Day   Smokeless tobacco: Never  Substance Use Topics   Alcohol use: Yes    Comment: Occ   Drug use: No     Allergies   Albuterol and Aspirin   Review of Systems Review of Systems Pertinent negatives listed in HPI   Physical Exam Triage Vital Signs ED Triage Vitals  Enc Vitals Group     BP 02/13/21 1332 (!) 208/108     Pulse Rate 02/13/21 1332 71     Resp 02/13/21 1332 18     Temp 02/13/21 1332 98.5 F (36.9 C)     Temp Source 02/13/21 1332 Oral     SpO2 02/13/21 1332 98 %     Weight --      Height --      Head Circumference --      Peak Flow --      Pain Score 02/13/21 1301 0     Pain Loc --      Pain Edu? --      Excl. in [place]? --    No data found.  Updated Vital Signs BP (!) 192/99 (BP Location: Right Arm)   Pulse 71   Temp 98.5 F (36.9 C) (Oral)   Resp 18   SpO2 98%   Visual Acuity Right Eye Distance:   Left Eye Distance:   Bilateral Distance:    Right  Eye Near:   Left Eye Near:    Bilateral Near:     Physical Exam General appearance: Alert, well developed, well nourished, cooperative  Head: Normocephalic, without obvious abnormality, atraumatic Respiratory: Respirations even and unlabored, normal respiratory rate Heart: Rate and rhythm normal. No gallop or murmurs noted on exam  Extremities: No gross deformities Skin: Skin color, texture, turgor normal. No rashes seen  Psych: Appropriate mood and affect. Neurologic: GCS 15, normal coordination, normal gait UC Treatments / Results  Labs (all labs ordered are listed, but only abnormal results are displayed) Labs Reviewed - No data to display  EKG   Radiology No results found.  Procedures Procedures (including critical care time)  Medications Ordered in UC Medications  cloNIDine (CATAPRES) tablet 0.1 mg (0.1 mg Oral Given 02/13/21 1334)    Initial Impression / Assessment and Plan / UC Course  I have reviewed the triage vital signs and the nursing  notes.  Pertinent labs & imaging results that were available during my care of the patient were reviewed by me and considered in my medical decision making (see chart for details).    Accelerated hypertension increased amlodipine to 10 mg once daily for management of hypertension.  Patient given information to establish with [place] primary care for ongoing management of hypertension.  A courtesy one-time 90-day refill provided. Final Clinical Impressions(s) / UC Diagnoses   Final diagnoses:  Accelerated hypertension     Discharge Instructions      Contact [facility] to schedule a new patient appointment as your blood pressure is very poorly controlled which increases your risk for having a heart failure, heart attack, or stroke.     ED Prescriptions     Medication Sig Dispense Auth. Provider   amLODipine (NORVASC) 10 MG tablet  (Status: Discontinued) Take 1 tablet (10 mg total) by mouth daily. 90 tablet Scot Jun, FNP   amLODipine (NORVASC) 10 MG tablet Take 1 tablet (10 mg total) by mouth daily. 90 tablet Scot Jun, FNP      PDMP not reviewed this encounter.   Scot Jun, [place] 02/13/21 [phone]

## 2021-02-13 NOTE — Discharge Instructions (Addendum)
Contact [facility] to schedule a new patient appointment as your blood pressure is very poorly controlled which increases your risk for having a heart failure, heart attack, or stroke.

## 2022-06-12 ENCOUNTER — Emergency Department (HOSPITAL_COMMUNITY)
Admission: EM | Admit: 2022-06-12 | Discharge: 2022-06-16 | Disposition: E | Payer: BC Managed Care – PPO | Attending: Emergency Medicine | Admitting: Emergency Medicine

## 2022-06-12 DIAGNOSIS — I469 Cardiac arrest, cause unspecified: Secondary | ICD-10-CM | POA: Insufficient documentation

## 2022-06-12 DIAGNOSIS — Z79899 Other long term (current) drug therapy: Secondary | ICD-10-CM | POA: Insufficient documentation

## 2022-06-12 DIAGNOSIS — R464 Slowness and poor responsiveness: Secondary | ICD-10-CM | POA: Insufficient documentation

## 2022-06-12 MED ORDER — EPINEPHRINE 1 MG/10ML IJ SOSY
1.0000 mg | PREFILLED_SYRINGE | Freq: Once | INTRAMUSCULAR | Status: AC
  Administered 2022-06-12: 1 mg via INTRAVENOUS

## 2022-06-16 DIAGNOSIS — 419620001 Death: Secondary | SNOMED CT | POA: Diagnosis not present

## 2022-06-16 NOTE — ED Notes (Signed)
1 mg of Epi given at 0154 per verbal order from Dr Christy Gentles. Timothy Mejia
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
## 2022-06-16 NOTE — ED Notes (Signed)
At 0219, saline soaked gauze place over Pts eyes.
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
## 2022-06-16 NOTE — ED Notes (Signed)
Pulse check. Asystole. CPR resumed. Timothy Mejia

## 2022-06-16 DEATH — deceased
